# Patient Record
Sex: Female | Born: 1995 | Race: Black or African American | Hispanic: No | Marital: Single | Smoking: Never smoker
Health system: Southern US, Community
[De-identification: ages and names within clinical notes are randomized; demographics above are authoritative.]

## PROBLEM LIST (undated history)

## (undated) DIAGNOSIS — R569 Unspecified convulsions: Secondary | ICD-10-CM

---
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
## 2016-08-05 ENCOUNTER — Encounter (HOSPITAL_COMMUNITY): Payer: Self-pay | Admitting: Emergency Medicine

## 2016-08-05 ENCOUNTER — Emergency Department (HOSPITAL_COMMUNITY)
Admission: EM | Admit: 2016-08-05 | Discharge: 2016-08-05 | Disposition: A | Discharge status: Home / Self Care | Payer: Self-pay | Attending: Emergency Medicine | Admitting: Emergency Medicine

## 2016-08-05 DIAGNOSIS — Z760 Encounter for issue of repeat prescription: Secondary | ICD-10-CM | POA: Insufficient documentation

## 2016-08-05 HISTORY — DX: Unspecified convulsions: R56.9

## 2016-08-05 MED ORDER — LEVETIRACETAM 500 MG PO TABS: 500.0000 mg | ORAL_TABLET | Freq: Two times a day (BID) | ORAL | 0 refills | Status: AC

## 2016-08-05 NOTE — Discharge Instructions (Signed)
You have had your medication refilled today, but it's very important that you see your regular neurologist for management of your epilepsy and medication refills. Make an appointment to see them in the next 1-2 weeks for ongoing refills and management of your seizures. Return to the ER for changes or worsening symptoms.

## 2016-08-05 NOTE — ED Triage Notes (Signed)
Pt sts took last seizure medication pill last night; pt sts recently moved here

## 2016-08-05 NOTE — ED Notes (Signed)
Declined W/C at D/C and was escorted to lobby by RN. 

## 2016-08-05 NOTE — ED Provider Notes (Signed)
[facility]-EMERGENCY DEPT Provider Note   CSN: [id number] Arrival date & time: 08/05/16  1004  By signing my name below, I, Donna House, attest that this documentation has been prepared under the direction and in the presence of Donna Drumwright Camprubi-Soms, PA-C. Electronically Signed: Javier Dockerobert Ryan House, ER Scribe. 03/31/2016. 10:26 AM.   History   Chief Complaint Chief Complaint  Patient presents with  . Medication Refill   The history is provided by the patient and medical records. No language interpreter was used.  Medication Refill  Medications/supplies requested:  Keppra 500mg  BID Reason for request:  Medications ran out Medications taken before: yes - see home medications (called [facility] pharmacy to verify)   Patient has complete original prescription information: no   Source of information:  Pharmacy   HPI Comments: Donna House is a 20 y.o. female with a PMHx of epilepsy, who presents to the Emergency Department for a medication refill. She takes "levocam", but clarifies that she looked it up and it's spelled "Levetiracetam" (Keppra). Recalls that she takes 500mg  twice daily, and denies taking any other medications for her epilepsy. She is from [place], [place] and has run out of her medication, having taken the last dose last night. She has not had any seizures since running out of her medication, and denies any current complaints. She denies HA, lightheadedness, vision changes, seizure-like activity, fevers, chills, CP, SOB, abd pain, N/V/D/C, hematuria, dysuria, myalgias, arthralgias, numbness, tingling, weakness, or any other concerns/complaints. Her prescribing physician is "Dr. Homero House" at [facility] in [place] (can't recall his first name). Her pharmacy is [facility] in [place] [phone]).    Past Medical History:  Diagnosis Date  . Seizures (HCC)     There are no active problems to display for this patient.   History reviewed. No pertinent surgical  history.  OB History    No data available       Home Medications    Prior to Admission medications   Not on File    Family History History reviewed. No pertinent family history.  Social History Social History  Substance Use Topics  . Smoking status: Never Smoker  . Smokeless tobacco: Never Used  . Alcohol use No     Allergies   Patient has no known allergies.   Review of Systems Review of Systems  Constitutional: Negative for chills and fever.  Eyes: Negative for visual disturbance.  Respiratory: Negative for shortness of breath.   Cardiovascular: Negative for chest pain.  Gastrointestinal: Negative for abdominal pain, constipation, diarrhea, nausea and vomiting.  Genitourinary: Negative for dysuria and hematuria.  Musculoskeletal: Negative for arthralgias and myalgias.  Skin: Negative for color change.  Allergic/Immunologic: Negative for immunocompromised state.  Neurological: Negative for seizures, weakness, light-headedness, numbness and headaches.  Psychiatric/Behavioral: Negative for confusion.  10 Systems reviewed and are negative for acute change except as noted in the HPI.    Physical Exam Updated Vital Signs BP 138/88 (BP Location: Right Arm)   Pulse 87   Temp 98.4 F (36.9 C) (Oral)   Resp 18   SpO2 100%   Physical Exam  Constitutional: She is oriented to person, place, and time. Vital signs are normal. She appears well-developed and well-nourished.  Non-toxic appearance. No distress.  Afebrile, nontoxic, NAD  HENT:  Head: Normocephalic and atraumatic.  Mouth/Throat: Mucous membranes are normal.  Eyes: Conjunctivae and EOM are normal. Right eye exhibits no discharge. Left eye exhibits no discharge.  Neck: Normal range of motion. Neck supple.  Cardiovascular:  Normal rate and intact distal pulses.   Pulmonary/Chest: Effort normal. No respiratory distress.  Abdominal: Normal appearance. She exhibits no distension.  Musculoskeletal: Normal range  of motion.  Neurological: She is alert and oriented to person, place, and time. She has normal strength. No sensory deficit. Gait normal. GCS eye subscore is 4. GCS verbal subscore is 5. GCS motor subscore is 6.  No focal neuro deficits, A&O x4, gait steady  Skin: Skin is warm, dry and intact. No rash noted.  Psychiatric: She has a normal mood and affect. Her behavior is normal.  Nursing note and vitals reviewed.    ED Treatments / Results  Labs (all labs ordered are listed, but only abnormal results are displayed) Labs Reviewed - No data to display  EKG  EKG Interpretation None       Radiology No results found.  Procedures Procedures (including critical care time)  Medications Ordered in ED Medications - No data to display   Initial Impression / Assessment and Plan / ED Course  I have reviewed the triage vital signs and the nursing notes.  Pertinent labs & imaging results that were available during my care of the patient were reviewed by me and considered in my medical decision making (see chart for details).  Clinical Course     20 y.o. female here for med refill of keppra. Took last dose last night, due for dose this morning. No seizures or complaints/concerns. Called [facility] in [place] to verify the rx, and they were able to confirm that she takes keppra 500mg  BID. No other meds on file. Will provider courtesy refill today, but encouraged pt to see her regular neurologist for future refills and ongoing management of her epilepsy. I have given explicit precautions to return to the ER including for any other new or worsening symptoms. The patient understands and accepts the medical plan as it's been dictated and I have answered their questions. Discharge instructions concerning home care and prescriptions have been given. The patient is STABLE and is discharged to home in good condition.   I personally performed the services described in this documentation,  which was scribed in my presence. The recorded information has been reviewed and is accurate.    Final Clinical Impressions(s) / ED Diagnoses   Final diagnoses:  Encounter for medication refill    New Prescriptions New Prescriptions   LEVETIRACETAM (KEPPRA) 500 MG TABLET    Take 1 tablet (500 mg total) by mouth 2 (two) times daily.     [address] James DriveMercedes Fowleramprubi-Soms, PA-C 08/05/16 1040    Margarita Grizzleanielle Ray, MD 08/06/16 [phone]
